# Patient Record
Sex: Male | Born: 1980 | Race: White | Hispanic: No | Marital: Single | Smoking: Never smoker
Health system: Southern US, Community
[De-identification: ages and names within clinical notes are randomized; demographics above are authoritative.]

## PROBLEM LIST (undated history)

## (undated) DIAGNOSIS — I10 Essential (primary) hypertension: Secondary | ICD-10-CM

---

## 2018-04-08 ENCOUNTER — Other Ambulatory Visit: Payer: Self-pay

## 2018-04-08 ENCOUNTER — Emergency Department: Payer: BLUE CROSS/BLUE SHIELD

## 2018-04-08 ENCOUNTER — Emergency Department
Admission: EM | Admit: 2018-04-08 | Discharge: 2018-04-08 | Disposition: A | Discharge status: Home / Self Care | Payer: BLUE CROSS/BLUE SHIELD | Attending: Emergency Medicine | Admitting: Emergency Medicine

## 2018-04-08 DIAGNOSIS — N200 Calculus of kidney: Secondary | ICD-10-CM | POA: Diagnosis not present

## 2018-04-08 DIAGNOSIS — R1031 Right lower quadrant pain: Secondary | ICD-10-CM | POA: Diagnosis present

## 2018-04-08 HISTORY — DX: Essential (primary) hypertension: I10

## 2018-04-08 LAB — CBC
HEMATOCRIT: 47.2 % (ref 40.0–52.0)
Hemoglobin: 16.2 g/dL (ref 13.0–18.0)
MCH: 29.5 pg (ref 26.0–34.0)
MCHC: 34.4 g/dL (ref 32.0–36.0)
MCV: 85.8 fL (ref 80.0–100.0)
Platelets: 222 10*3/uL (ref 150–440)
RBC: 5.5 MIL/uL (ref 4.40–5.90)
RDW: 13.8 % (ref 11.5–14.5)
WBC: 13.1 10*3/uL — ABNORMAL HIGH (ref 3.8–10.6)

## 2018-04-08 LAB — COMPREHENSIVE METABOLIC PANEL
ALBUMIN: 4.2 g/dL (ref 3.5–5.0)
ALT: 34 U/L (ref 0–44)
AST: 30 U/L (ref 15–41)
Alkaline Phosphatase: 54 U/L (ref 38–126)
Anion gap: 8 (ref 5–15)
BUN: 14 mg/dL (ref 6–20)
CHLORIDE: 102 mmol/L (ref 98–111)
CO2: 26 mmol/L (ref 22–32)
Calcium: 8.8 mg/dL — ABNORMAL LOW (ref 8.9–10.3)
Creatinine, Ser: 1.6 mg/dL — ABNORMAL HIGH (ref 0.61–1.24)
GFR calc Af Amer: >60 mL/min (ref >60)
GFR calc non Af Amer: 54 mL/min — ABNORMAL LOW (ref >60)
GLUCOSE: 123 mg/dL — AB (ref 70–99)
POTASSIUM: 3.9 mmol/L (ref 3.5–5.1)
Sodium: 136 mmol/L (ref 135–145)
Total Bilirubin: 1.5 mg/dL — ABNORMAL HIGH (ref 0.3–1.2)
Total Protein: 7.8 g/dL (ref 6.5–8.1)

## 2018-04-08 LAB — LIPASE, BLOOD: Lipase: 25 U/L (ref 11–51)

## 2018-04-08 MED ORDER — KETOROLAC TROMETHAMINE 10 MG PO TABS: 10.0000 mg | ORAL_TABLET | Freq: Three times a day (TID) | ORAL | 0 refills | Status: AC | PRN

## 2018-04-08 MED ORDER — IOPAMIDOL (ISOVUE-300) INJECTION 61%
100.0000 mL | Freq: Once | INTRAVENOUS | Status: AC | PRN
  Administered 2018-04-08: 100 mL via INTRAVENOUS
  Filled 2018-04-08: qty 100

## 2018-04-08 MED ORDER — TAMSULOSIN HCL 0.4 MG PO CAPS
ORAL_CAPSULE | ORAL | Status: AC
  Filled 2018-04-08: qty 1

## 2018-04-08 MED ORDER — TAMSULOSIN HCL 0.4 MG PO CAPS: 0.4000 mg | ORAL_CAPSULE | Freq: Every day | ORAL | 0 refills | Status: AC

## 2018-04-08 MED ORDER — TAMSULOSIN HCL 0.4 MG PO CAPS
0.4000 mg | ORAL_CAPSULE | Freq: Once | ORAL | Status: AC
  Administered 2018-04-08: 0.4 mg via ORAL

## 2018-04-08 MED ORDER — KETOROLAC TROMETHAMINE 30 MG/ML IJ SOLN
30.0000 mg | Freq: Once | INTRAMUSCULAR | Status: AC
  Administered 2018-04-08: 30 mg via INTRAVENOUS
  Filled 2018-04-08: qty 1

## 2018-04-08 MED ORDER — SODIUM CHLORIDE 0.9 % IV BOLUS
1000.0000 mL | Freq: Once | INTRAVENOUS | Status: AC
  Administered 2018-04-08: 1000 mL via INTRAVENOUS

## 2018-04-08 MED ORDER — ONDANSETRON HCL 4 MG PO TABS: 4.0000 mg | ORAL_TABLET | Freq: Three times a day (TID) | ORAL | 0 refills | Status: AC | PRN

## 2018-04-08 NOTE — ED Notes (Signed)
Pt discharged home after verbalizing understanding of discharge instructions; nad noted. 

## 2018-04-08 NOTE — Discharge Instructions (Signed)
Please seek medical attention for any high fevers, chest pain, shortness of breath, change in behavior, persistent vomiting, bloody stool or any other new or concerning symptoms.  

## 2018-04-08 NOTE — ED Provider Notes (Signed)
[facility] Emergency Department Provider Note   ____________________________________________   I have reviewed the triage vital signs and the nursing notes.   HISTORY  Chief Complaint Abdominal Pain   History limited by: Not Limited   HPI Bradley Guzman is a 37 y.o. male who presents to the emergency department today because of concerns for abdominal pain.  The abdominal pain started 2 days ago and stopped yesterday.  Patient states initially was burning.  Is located in his right lower quadrant.  Yesterday it became sharp and more severe.  He states today the pain is been completely gone.  He had not noticed any change in defecation or urination.  He denies any vomiting.  Did have low-grade fevers yesterday.  Patient denies similar symptoms in the past.  Denies any unusual ingestion.   History reviewed. No pertinent past medical history.  There are no active problems to display for this patient.   History reviewed. No pertinent surgical history.  Prior to Admission medications   Not on File    Allergies Patient has no known allergies.  No family history on file.  Social History Social History   Tobacco Use  . Smoking status: Never Smoker  . Smokeless tobacco: Never Used  Substance Use Topics  . Alcohol use: Never    Frequency: Never  . Drug use: Never    Review of Systems Constitutional: Positive for fevers. Eyes: No visual changes. ENT: No sore throat. Cardiovascular: Denies chest pain. Respiratory: Denies shortness of breath. Gastrointestinal: Positive for abdominal pain which has resolved. Genitourinary: Negative for dysuria. Musculoskeletal: Negative for back pain. Skin: Negative for rash. Neurological: Negative for headaches, focal weakness or numbness.  ____________________________________________   PHYSICAL EXAM:  VITAL SIGNS: ED Triage Vitals  Enc Vitals Group     BP 04/08/18 1902 (!) 176/88     Pulse Rate 04/08/18  1902 (!) 107     Resp 04/08/18 1902 16     Temp 04/08/18 1902 98.6 F (37 C)     Temp Source 04/08/18 1902 Oral     SpO2 04/08/18 1902 98 %     Weight 04/08/18 1904 280 lb (127 kg)     Height 04/08/18 1904 5\' 5"  (1.651 m)     Head Circumference --      Peak Flow --      Pain Score 04/08/18 1904 0   Constitutional: Alert and oriented.  Eyes: Conjunctivae are normal.  ENT      Head: Normocephalic and atraumatic.      Nose: No congestion/rhinnorhea.      Mouth/Throat: Mucous membranes are moist.      Neck: No stridor. Hematological/Lymphatic/Immunilogical: No cervical lymphadenopathy. Cardiovascular: Normal rate, regular rhythm.  No murmurs, rubs, or gallops. Respiratory: Normal respiratory effort without tachypnea nor retractions. Breath sounds are clear and equal bilaterally. No wheezes/rales/rhonchi. Gastrointestinal: Soft and non tender. No rebound. No guarding.  Genitourinary: Deferred Musculoskeletal: Normal range of motion in all extremities. No lower extremity edema. Neurologic:  Normal speech and language. No gross focal neurologic deficits are appreciated.  Skin:  Skin is warm, dry and intact. No rash noted. Psychiatric: Mood and affect are normal. Speech and behavior are normal. Patient exhibits appropriate insight and judgment.  ____________________________________________    LABS (pertinent positives/negatives)  Lipase 25 CBC 13.1, hgb 16.2, plt 222 CMP glu 123, cr 1.60, t bili 1.5 ____________________________________________   EKG  None  ____________________________________________    RADIOLOGY  CT abd/pel Distal right ureteral stone  ____________________________________________   PROCEDURES  Procedures  ____________________________________________   INITIAL IMPRESSION / ASSESSMENT AND PLAN / ED COURSE  Pertinent labs & imaging results that were available during my care of the patient were reviewed by me and considered in my medical decision  making (see chart for details).   Patient presented to the emergency department today because of concerns for right lower quadrant abdominal pain.  Patient states the pain had improved since yesterday.  On exam patient has no tenderness in the right lower quadrant.  Discussed possibility with patient.  Discussed possibility of kidney stone versus ruptured appendicitis although I feel the latter was much less likely.  CT scan does show a distal ureteral stone on the right side.  Discussed this finding with the patient.  Will plan on discharging with pain medication nausea medication and Flomax.  Will give urology follow-up.   ____________________________________________   FINAL CLINICAL IMPRESSION(S) / ED DIAGNOSES  Final diagnoses:  Kidney stone     Note: This dictation was prepared with Dragon dictation. Any transcriptional errors that result from this process are unintentional     Phineas Semen, MD 04/08/18 2239

## 2018-04-08 NOTE — ED Notes (Signed)
Pt back from CT

## 2018-04-08 NOTE — ED Triage Notes (Signed)
Pt reports that Saturday night he was having burning sensation in stomach that lasted into Sunday - pt had not had any pain today - denies N/V/D

## 2018-04-08 NOTE — ED Notes (Signed)
Pt presents with RLQ abdominal pain on Saturday and Sunday, until early this morning. He states that he is here now, even though pain is resolved, because he is "extremely terrified" that something terrible is going on. Pt denies urinary symptoms including frequency, retention, hematuria. Denies vomiting, endorses nausea yesterday. Pt alert & oriented with NAD noted.

## 2019-10-08 IMAGING — CT CT ABD-PELV W/ CM
2 of 4 series · 17 of 46 positions shown, 19 images · IV contrast (APPLIED)
Comparison: None.

CLINICAL DATA: Right quadrant abdominal pain since [REDACTED] and
[REDACTED].

EXAM:
CT ABDOMEN AND PELVIS WITH CONTRAST
TECHNIQUE: Multidetector CT imaging of the abdomen and pelvis was performed
using the standard protocol following bolus administration of
intravenous contrast.
CONTRAST:  100mL 8BBI53-OII IOPAMIDOL (8BBI53-OII) INJECTION 61%

[Series 2: routine abd/pel with · axial · 0.84mm/px · z∈[-599,-149]mm · 14 of 100 slices shown, 16 images]
[im 5/100  soft-tissue]
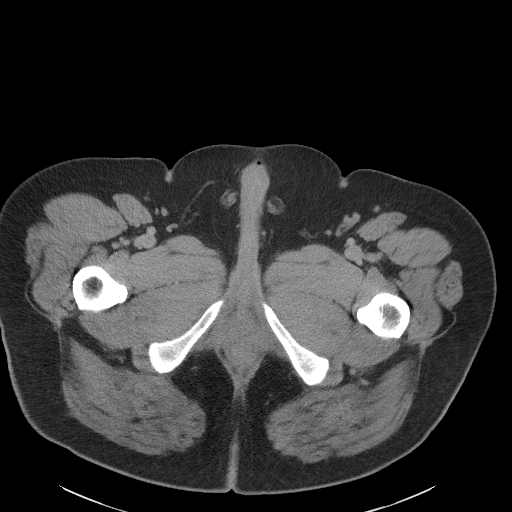
[im 5/100  bone]
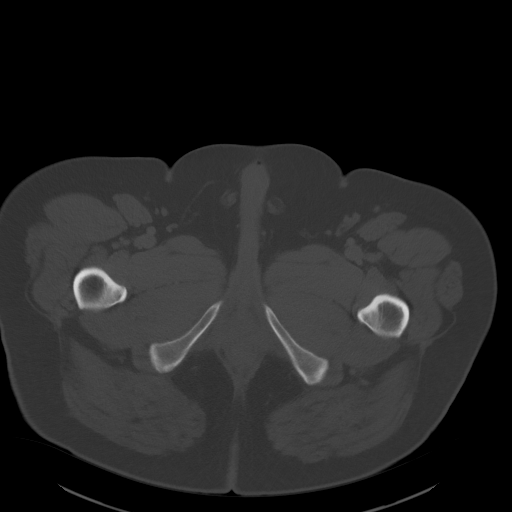
[im 13/100  soft-tissue]
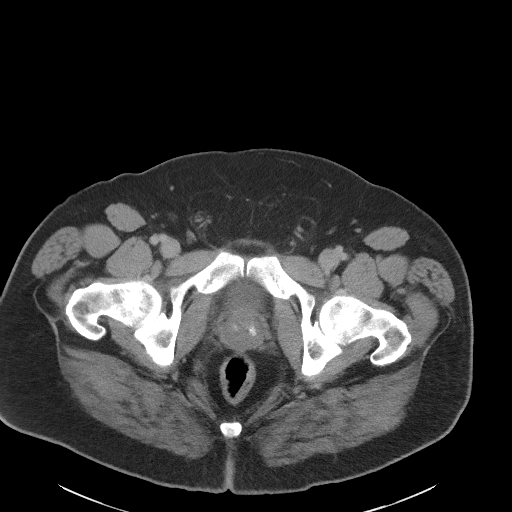
[im 18/100  soft-tissue]
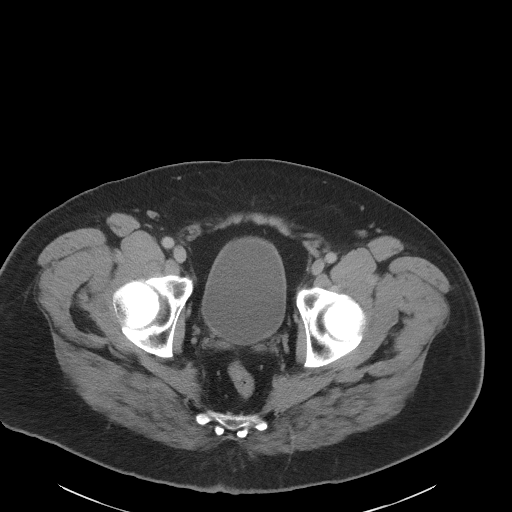
[im 26/100  soft-tissue]
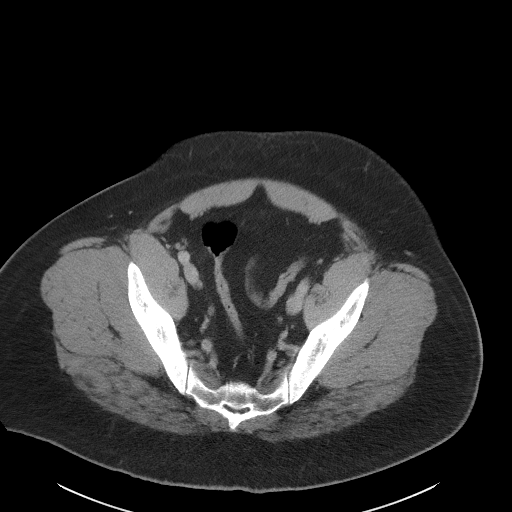
[im 35/100  soft-tissue]
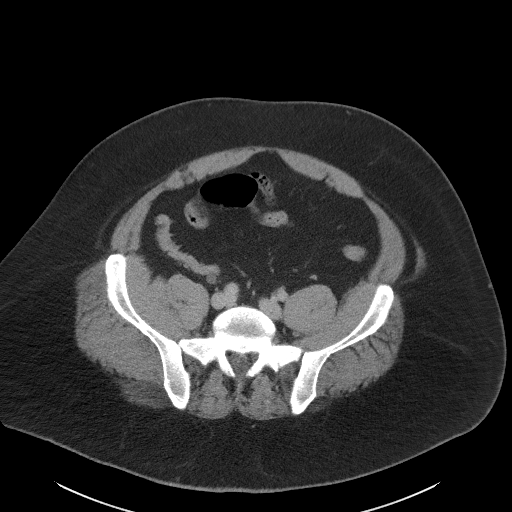
[im 39/100  soft-tissue]
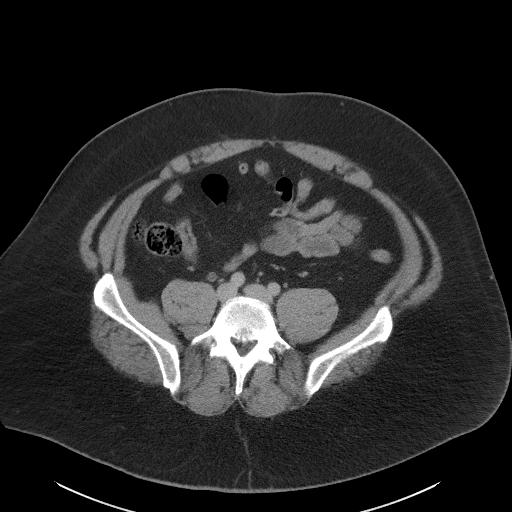
[im 48/100  soft-tissue]
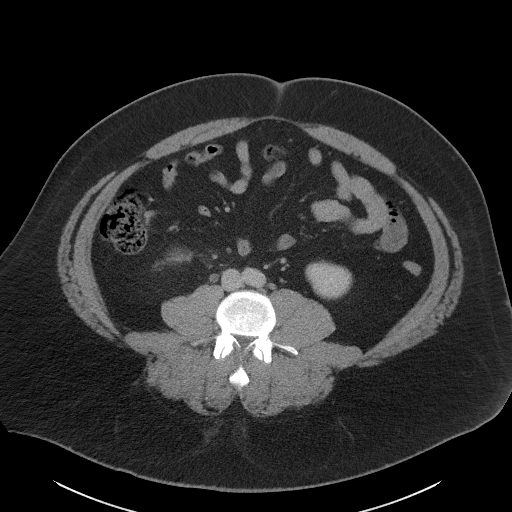
[im 52/100  soft-tissue]
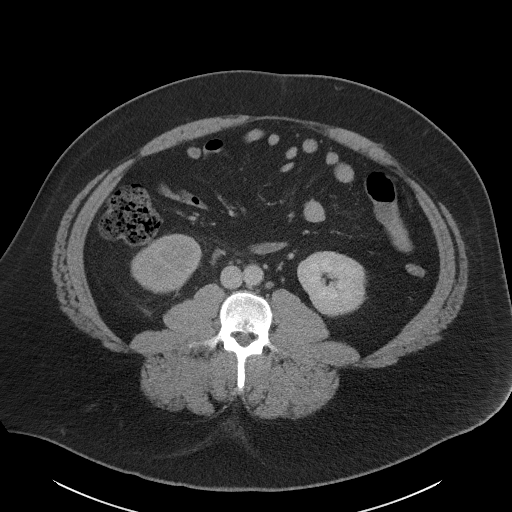
[im 61/100  soft-tissue]
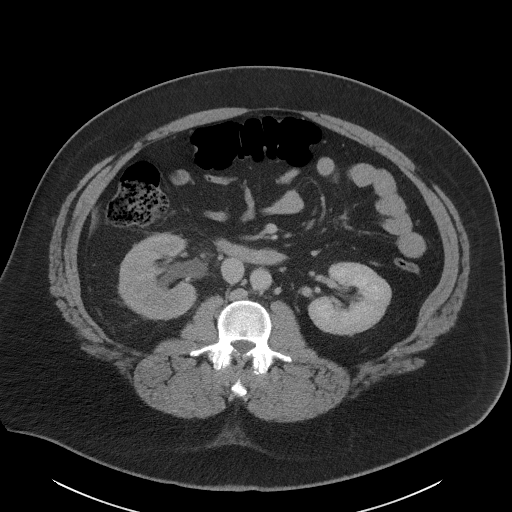
[im 61/100  bone]
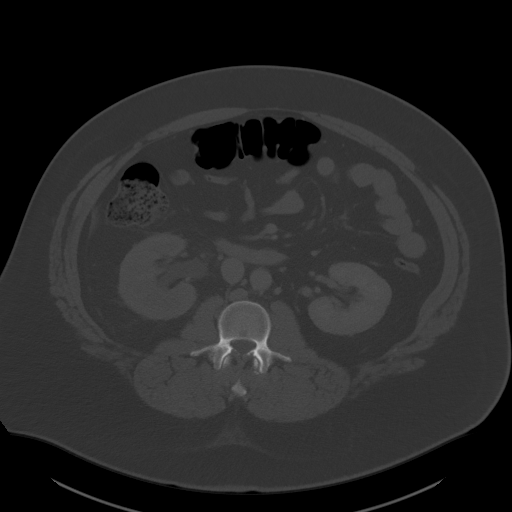
[im 65/100  soft-tissue]
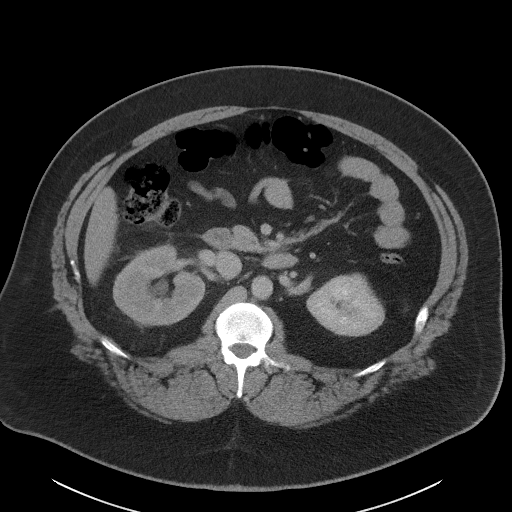
[im 74/100  soft-tissue]
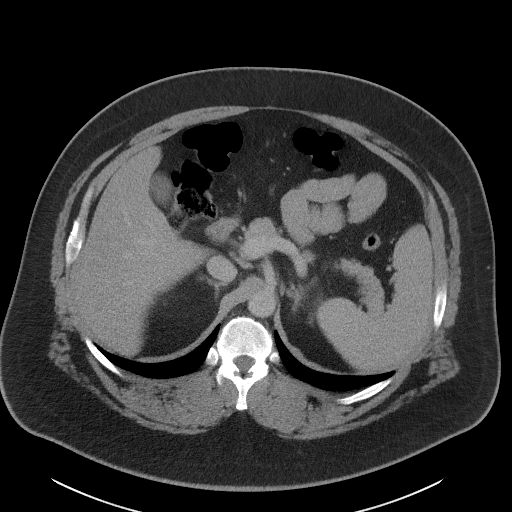
[im 82/100  soft-tissue]
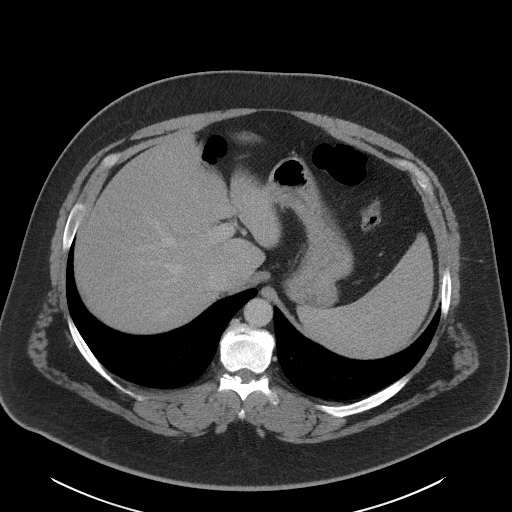
[im 87/100  soft-tissue]
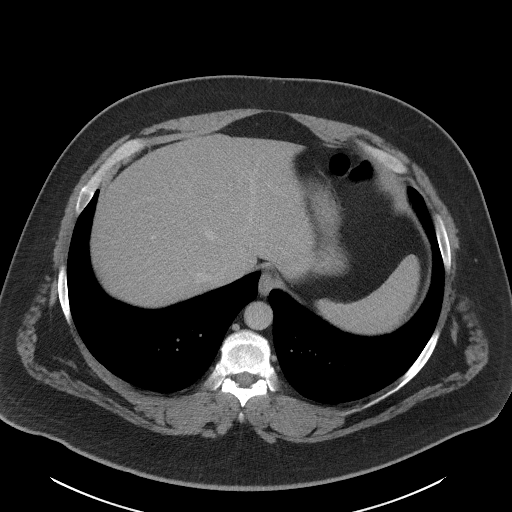
[im 95/100  soft-tissue]
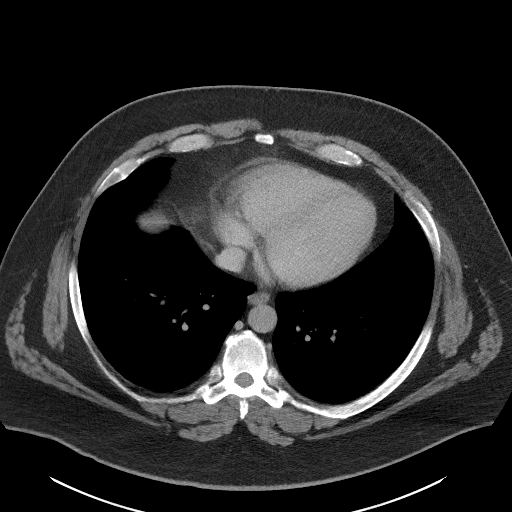

[Series 5: coronal st · coronal · 0.94mm/px · 3 of 122 slices shown]
[im 41/122  soft-tissue]
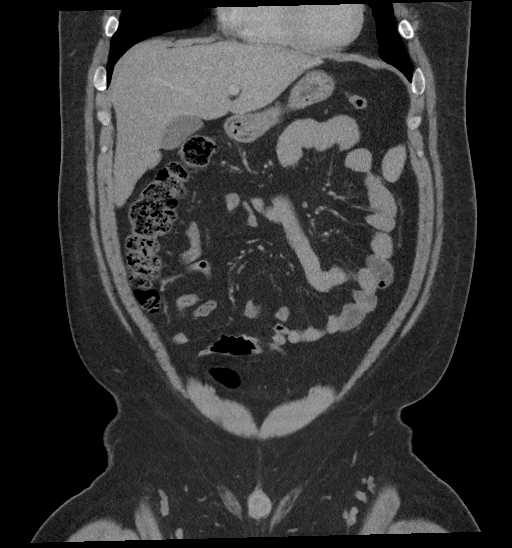
[im 54/122  soft-tissue]
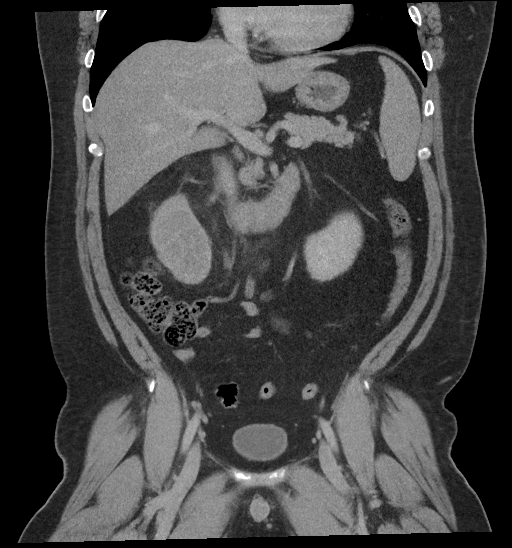
[im 68/122  soft-tissue]
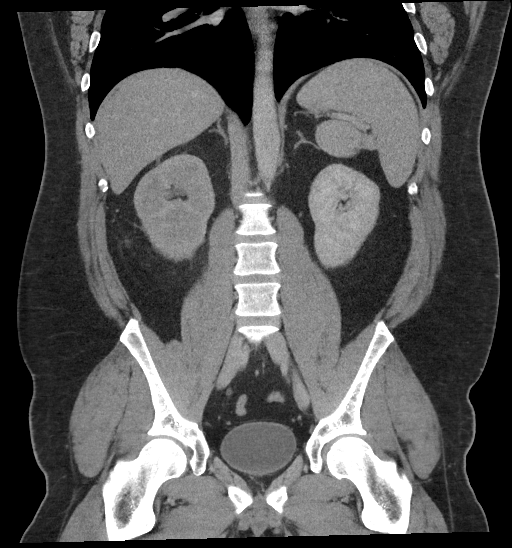

[17 of 46 positions shown; findings below may reference images not displayed]

FINDINGS: LOWER CHEST: Lung bases are clear. Included heart size is normal. No
pericardial effusion.

HEPATOBILIARY: Liver and gallbladder are normal.

PANCREAS: Normal.

SPLEEN: Normal.

ADRENALS/URINARY TRACT: Right-sided hydroureteronephrosis secondary
to a 4-5 mm distal right ureteral stone just proximal to the right
UVJ by approximately 1-2 cm. Differential enhancement due to
obstruction is noted of the right kidney relative to left. No
nephrolithiasis is identified. Tiny too small to characterize
hypodensities statistically consistent cysts are noted of both
kidneys. The urinary bladder is unremarkable.

STOMACH/BOWEL: The stomach, small and large bowel are normal in
course and caliber without inflammatory changes. Normal appendix.

VASCULAR/LYMPHATIC: Aortoiliac vessels are normal in course and
caliber. No lymphadenopathy by CT size criteria.

REPRODUCTIVE: Normal size prostate with central zone calcifications.
Seminal vesicles are unremarkable.

OTHER: No intraperitoneal free fluid or free air.

MUSCULOSKELETAL: Nonacute.
IMPRESSION: Right-sided mild-to-moderate hydroureteronephrosis secondary to a
4-5 mm distal right ureteral calculus just proximal to the right
UVJ.
